# Patient Record
Sex: Female | Born: 1995 | Race: White | Hispanic: No | Marital: Single | Smoking: Never smoker
Health system: Southern US, Community
[De-identification: ages and names within clinical notes are randomized; demographics above are authoritative.]

## PROBLEM LIST (undated history)

## (undated) DIAGNOSIS — K5010 Crohn's disease of large intestine without complications: Secondary | ICD-10-CM

---
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
## 2017-01-24 ENCOUNTER — Ambulatory Visit: Payer: BLUE CROSS/BLUE SHIELD

## 2017-02-03 ENCOUNTER — Ambulatory Visit (INDEPENDENT_AMBULATORY_CARE_PROVIDER_SITE_OTHER): Payer: BLUE CROSS/BLUE SHIELD | Admitting: *Deleted

## 2017-02-03 DIAGNOSIS — Z23 Encounter for immunization: Secondary | ICD-10-CM | POA: Diagnosis not present

## 2017-03-31 DIAGNOSIS — K5010 Crohn's disease of large intestine without complications: Secondary | ICD-10-CM | POA: Diagnosis not present

## 2017-03-31 DIAGNOSIS — K5080 Crohn's disease of both small and large intestine without complications: Secondary | ICD-10-CM | POA: Diagnosis not present

## 2017-03-31 DIAGNOSIS — R197 Diarrhea, unspecified: Secondary | ICD-10-CM | POA: Diagnosis not present

## 2017-03-31 DIAGNOSIS — R1084 Generalized abdominal pain: Secondary | ICD-10-CM | POA: Diagnosis not present

## 2017-04-06 DIAGNOSIS — D509 Iron deficiency anemia, unspecified: Secondary | ICD-10-CM | POA: Diagnosis not present

## 2017-04-13 DIAGNOSIS — D509 Iron deficiency anemia, unspecified: Secondary | ICD-10-CM | POA: Diagnosis not present

## 2017-05-03 DIAGNOSIS — K5080 Crohn's disease of both small and large intestine without complications: Secondary | ICD-10-CM | POA: Diagnosis not present

## 2017-05-03 DIAGNOSIS — K3189 Other diseases of stomach and duodenum: Secondary | ICD-10-CM | POA: Diagnosis not present

## 2017-05-12 DIAGNOSIS — D509 Iron deficiency anemia, unspecified: Secondary | ICD-10-CM | POA: Diagnosis not present

## 2017-05-24 ENCOUNTER — Ambulatory Visit: Payer: BLUE CROSS/BLUE SHIELD

## 2017-05-25 ENCOUNTER — Ambulatory Visit (INDEPENDENT_AMBULATORY_CARE_PROVIDER_SITE_OTHER): Payer: BLUE CROSS/BLUE SHIELD | Admitting: *Deleted

## 2017-05-25 DIAGNOSIS — Z23 Encounter for immunization: Secondary | ICD-10-CM | POA: Diagnosis not present

## 2017-05-27 DIAGNOSIS — D123 Benign neoplasm of transverse colon: Secondary | ICD-10-CM | POA: Diagnosis not present

## 2017-05-27 DIAGNOSIS — D125 Benign neoplasm of sigmoid colon: Secondary | ICD-10-CM | POA: Diagnosis not present

## 2017-05-27 DIAGNOSIS — K633 Ulcer of intestine: Secondary | ICD-10-CM | POA: Diagnosis not present

## 2017-05-27 DIAGNOSIS — K626 Ulcer of anus and rectum: Secondary | ICD-10-CM | POA: Diagnosis not present

## 2017-05-27 DIAGNOSIS — R197 Diarrhea, unspecified: Secondary | ICD-10-CM | POA: Diagnosis not present

## 2017-05-27 DIAGNOSIS — K5010 Crohn's disease of large intestine without complications: Secondary | ICD-10-CM | POA: Diagnosis not present

## 2017-06-01 DIAGNOSIS — K50111 Crohn's disease of large intestine with rectal bleeding: Secondary | ICD-10-CM | POA: Diagnosis not present

## 2017-06-21 DIAGNOSIS — K50111 Crohn's disease of large intestine with rectal bleeding: Secondary | ICD-10-CM | POA: Diagnosis not present

## 2017-07-04 DIAGNOSIS — Z111 Encounter for screening for respiratory tuberculosis: Secondary | ICD-10-CM | POA: Diagnosis not present

## 2017-07-04 DIAGNOSIS — K50811 Crohn's disease of both small and large intestine with rectal bleeding: Secondary | ICD-10-CM | POA: Diagnosis not present

## 2017-07-04 DIAGNOSIS — R197 Diarrhea, unspecified: Secondary | ICD-10-CM | POA: Diagnosis not present

## 2017-07-04 DIAGNOSIS — D509 Iron deficiency anemia, unspecified: Secondary | ICD-10-CM | POA: Diagnosis not present

## 2017-07-06 DIAGNOSIS — K5080 Crohn's disease of both small and large intestine without complications: Secondary | ICD-10-CM | POA: Diagnosis not present

## 2017-07-06 DIAGNOSIS — K50111 Crohn's disease of large intestine with rectal bleeding: Secondary | ICD-10-CM | POA: Diagnosis not present

## 2017-07-06 DIAGNOSIS — D509 Iron deficiency anemia, unspecified: Secondary | ICD-10-CM | POA: Diagnosis not present

## 2017-07-06 DIAGNOSIS — R197 Diarrhea, unspecified: Secondary | ICD-10-CM | POA: Diagnosis not present

## 2017-07-12 DIAGNOSIS — K50919 Crohn's disease, unspecified, with unspecified complications: Secondary | ICD-10-CM | POA: Diagnosis not present

## 2017-07-26 DIAGNOSIS — K50919 Crohn's disease, unspecified, with unspecified complications: Secondary | ICD-10-CM | POA: Diagnosis not present

## 2017-08-23 DIAGNOSIS — K5090 Crohn's disease, unspecified, without complications: Secondary | ICD-10-CM | POA: Diagnosis not present

## 2017-08-24 DIAGNOSIS — K5080 Crohn's disease of both small and large intestine without complications: Secondary | ICD-10-CM | POA: Diagnosis not present

## 2017-08-24 DIAGNOSIS — R1084 Generalized abdominal pain: Secondary | ICD-10-CM | POA: Diagnosis not present

## 2017-08-24 DIAGNOSIS — R197 Diarrhea, unspecified: Secondary | ICD-10-CM | POA: Diagnosis not present

## 2017-08-24 DIAGNOSIS — Z79899 Other long term (current) drug therapy: Secondary | ICD-10-CM | POA: Diagnosis not present

## 2017-08-26 DIAGNOSIS — K5080 Crohn's disease of both small and large intestine without complications: Secondary | ICD-10-CM | POA: Diagnosis not present

## 2017-09-15 DIAGNOSIS — M79662 Pain in left lower leg: Secondary | ICD-10-CM | POA: Diagnosis not present

## 2017-09-15 DIAGNOSIS — D509 Iron deficiency anemia, unspecified: Secondary | ICD-10-CM | POA: Diagnosis not present

## 2017-09-15 DIAGNOSIS — M7662 Achilles tendinitis, left leg: Secondary | ICD-10-CM | POA: Diagnosis not present

## 2017-09-26 DIAGNOSIS — D509 Iron deficiency anemia, unspecified: Secondary | ICD-10-CM | POA: Diagnosis not present

## 2017-10-18 DIAGNOSIS — K5080 Crohn's disease of both small and large intestine without complications: Secondary | ICD-10-CM | POA: Diagnosis not present

## 2017-11-18 DIAGNOSIS — Z79899 Other long term (current) drug therapy: Secondary | ICD-10-CM | POA: Diagnosis not present

## 2017-11-18 DIAGNOSIS — K5080 Crohn's disease of both small and large intestine without complications: Secondary | ICD-10-CM | POA: Diagnosis not present

## 2017-11-18 DIAGNOSIS — L03314 Cellulitis of groin: Secondary | ICD-10-CM | POA: Diagnosis not present

## 2017-12-12 DIAGNOSIS — K5080 Crohn's disease of both small and large intestine without complications: Secondary | ICD-10-CM | POA: Diagnosis not present

## 2017-12-27 DIAGNOSIS — K5080 Crohn's disease of both small and large intestine without complications: Secondary | ICD-10-CM | POA: Diagnosis not present

## 2018-01-08 DIAGNOSIS — J069 Acute upper respiratory infection, unspecified: Secondary | ICD-10-CM | POA: Diagnosis not present

## 2018-01-08 DIAGNOSIS — J029 Acute pharyngitis, unspecified: Secondary | ICD-10-CM | POA: Diagnosis not present

## 2018-01-31 ENCOUNTER — Ambulatory Visit (INDEPENDENT_AMBULATORY_CARE_PROVIDER_SITE_OTHER): Payer: BLUE CROSS/BLUE SHIELD | Admitting: Women's Health

## 2018-01-31 ENCOUNTER — Encounter: Payer: Self-pay | Admitting: Women's Health

## 2018-01-31 VITALS — BP 118/78 | Ht 62.0 in | Wt 133.0 lb

## 2018-01-31 DIAGNOSIS — Z30011 Encounter for initial prescription of contraceptive pills: Secondary | ICD-10-CM | POA: Diagnosis not present

## 2018-01-31 DIAGNOSIS — Z01419 Encounter for gynecological examination (general) (routine) without abnormal findings: Secondary | ICD-10-CM

## 2018-01-31 DIAGNOSIS — Z113 Encounter for screening for infections with a predominantly sexual mode of transmission: Secondary | ICD-10-CM | POA: Diagnosis not present

## 2018-01-31 MED ORDER — NORETHIN ACE-ETH ESTRAD-FE 1-20 MG-MCG PO TABS: 1.0000 | ORAL_TABLET | Freq: Every day | ORAL | 4 refills | Status: AC

## 2018-01-31 NOTE — Patient Instructions (Signed)

## 2018-01-31 NOTE — Progress Notes (Signed)
Ellen Jones 05/20/1996 [id number]    History:    Presents for annual exam.  Regular monthly cycle on Loestrin until April or May.  Started on Remicade in February and has had some irregular cycles since starting, skipped 1 month, heavy flow the following month, occasional spotting,  normal cycle last month.  First partner both.  States is feeling much better since starting on the Remicade, has actually gained about 25 pounds and states is able to eat.  Gardasil series completed.    Past medical history, past surgical history, family history and social history were all reviewed and documented in the EPIC chart.  Student at [employer].  Parents healthy.  ROS:  A ROS was performed and pertinent positives and negatives are included.  Exam:  Vitals:   01/31/18 1623  BP: 118/78  Weight: 133 lb (60.3 kg)  Height: 5\' 2"  (1.575 m)   Body mass index is 24.33 kg/m.   General appearance:  Normal Thyroid:  Symmetrical, normal in size, without palpable masses or nodularity. Respiratory  Auscultation:  Clear without wheezing or rhonchi Cardiovascular  Auscultation:  Regular rate, without rubs, murmurs or gallops  Edema/varicosities:  Not grossly evident Abdominal  Soft,nontender, without masses, guarding or rebound.  Liver/spleen:  No organomegaly noted  Hernia:  None appreciated  Skin  Inspection:  Grossly normal   Breasts: Examined lying and sitting.     Right: Without masses, retractions, discharge or axillary adenopathy.     Left: Without masses, retractions, discharge or axillary adenopathy. Gentitourinary   Inguinal/mons:  Normal without inguinal adenopathy  External genitalia:  Normal  BUS/Urethra/Skene's glands:  Normal  Vagina:  Normal  Cervix:  Normal  Uterus:   normal in size, shape and contour.  Midline and mobile  Adnexa/parametria:     Rt: Without masses or tenderness.   Lt: Without masses or tenderness.  Anus and perineum: Normal   Assessment/Plan:  22 y.o. S [facility] G0  for annual exam.     Mostly monthly cycle on Loestrin Crohn's started on Remicade February 2019 Labs - GI  Plan: GC/chlamydia, denies need for STD screen.  Will continue on  Loestrin 1/20 and will call if continued problems with cycle,   Prescription, proper use, slight risk for blood clots and strokes reviewed.  Condoms encouraged until permanent partner and until cycles regulate.  SBEs, exercise, calcium rich foods, continue multivitamin daily.  Campus safety reviewed.   Harrington Challengerancy J [facility] LP[facility]NP, 5:37 PM 01/31/2018

## 2018-02-02 LAB — C. TRACHOMATIS/N. GONORRHOEAE RNA

## 2018-02-17 DIAGNOSIS — K5080 Crohn's disease of both small and large intestine without complications: Secondary | ICD-10-CM | POA: Diagnosis not present

## 2018-04-19 DIAGNOSIS — K5090 Crohn's disease, unspecified, without complications: Secondary | ICD-10-CM | POA: Diagnosis not present

## 2018-04-19 DIAGNOSIS — K5080 Crohn's disease of both small and large intestine without complications: Secondary | ICD-10-CM | POA: Diagnosis not present

## 2018-06-14 DIAGNOSIS — K5090 Crohn's disease, unspecified, without complications: Secondary | ICD-10-CM | POA: Diagnosis not present

## 2018-08-09 DIAGNOSIS — R740 Nonspecific elevation of levels of transaminase and lactic acid dehydrogenase [LDH]: Secondary | ICD-10-CM | POA: Diagnosis not present

## 2018-08-09 DIAGNOSIS — K5090 Crohn's disease, unspecified, without complications: Secondary | ICD-10-CM | POA: Diagnosis not present

## 2018-08-09 DIAGNOSIS — K5080 Crohn's disease of both small and large intestine without complications: Secondary | ICD-10-CM | POA: Diagnosis not present

## 2018-08-09 DIAGNOSIS — Z79899 Other long term (current) drug therapy: Secondary | ICD-10-CM | POA: Diagnosis not present

## 2018-08-15 DIAGNOSIS — R740 Nonspecific elevation of levels of transaminase and lactic acid dehydrogenase [LDH]: Secondary | ICD-10-CM | POA: Diagnosis not present

## 2018-08-23 DIAGNOSIS — K5080 Crohn's disease of both small and large intestine without complications: Secondary | ICD-10-CM | POA: Diagnosis not present

## 2018-08-23 DIAGNOSIS — R740 Nonspecific elevation of levels of transaminase and lactic acid dehydrogenase [LDH]: Secondary | ICD-10-CM | POA: Diagnosis not present

## 2018-08-24 DIAGNOSIS — J069 Acute upper respiratory infection, unspecified: Secondary | ICD-10-CM | POA: Diagnosis not present

## 2018-08-24 DIAGNOSIS — J029 Acute pharyngitis, unspecified: Secondary | ICD-10-CM | POA: Diagnosis not present

## 2018-09-19 DIAGNOSIS — Z880 Allergy status to penicillin: Secondary | ICD-10-CM | POA: Diagnosis not present

## 2018-09-19 DIAGNOSIS — K5090 Crohn's disease, unspecified, without complications: Secondary | ICD-10-CM | POA: Diagnosis not present

## 2018-09-19 DIAGNOSIS — S63501A Unspecified sprain of right wrist, initial encounter: Secondary | ICD-10-CM | POA: Diagnosis not present

## 2018-09-19 DIAGNOSIS — Z79899 Other long term (current) drug therapy: Secondary | ICD-10-CM | POA: Diagnosis not present

## 2018-09-19 DIAGNOSIS — Z881 Allergy status to other antibiotic agents status: Secondary | ICD-10-CM | POA: Diagnosis not present

## 2018-10-04 DIAGNOSIS — K5080 Crohn's disease of both small and large intestine without complications: Secondary | ICD-10-CM | POA: Diagnosis not present

## 2018-10-27 DIAGNOSIS — R740 Nonspecific elevation of levels of transaminase and lactic acid dehydrogenase [LDH]: Secondary | ICD-10-CM | POA: Diagnosis not present

## 2018-11-30 DIAGNOSIS — R7989 Other specified abnormal findings of blood chemistry: Secondary | ICD-10-CM | POA: Diagnosis not present

## 2018-11-30 DIAGNOSIS — R768 Other specified abnormal immunological findings in serum: Secondary | ICD-10-CM | POA: Diagnosis not present

## 2018-11-30 DIAGNOSIS — K5080 Crohn's disease of both small and large intestine without complications: Secondary | ICD-10-CM | POA: Diagnosis not present

## 2018-11-30 DIAGNOSIS — K5090 Crohn's disease, unspecified, without complications: Secondary | ICD-10-CM | POA: Diagnosis not present

## 2018-12-22 IMAGING — CT CT ABD-PELV W/O CM
2 of 4 series · 15 of 46 positions shown, 17 images · non-contrast
Comparison: 07/10/2016 CT

CLINICAL DATA: Diarrhea, fever, nausea and vomiting. History of
Crohn disease.

EXAM:
CT ABDOMEN AND PELVIS WITHOUT CONTRAST
TECHNIQUE: Multidetector CT imaging of the abdomen and pelvis was performed
following the standard protocol without IV contrast.

[Series 2: axial st · axial · 0.58mm/px · z∈[-402,-22]mm · 12 of 90 slices shown, 14 images]
[im 7/90  soft-tissue]
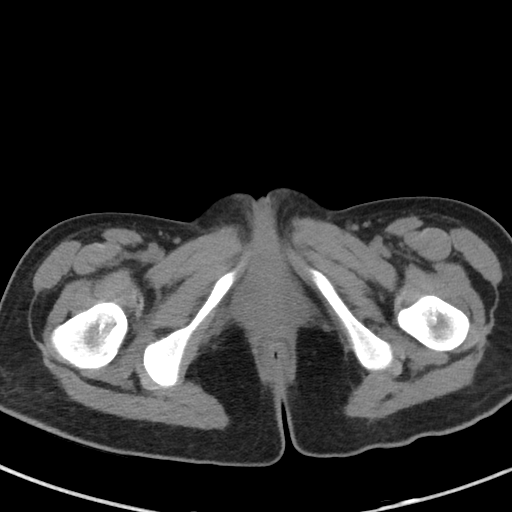
[im 7/90  bone]
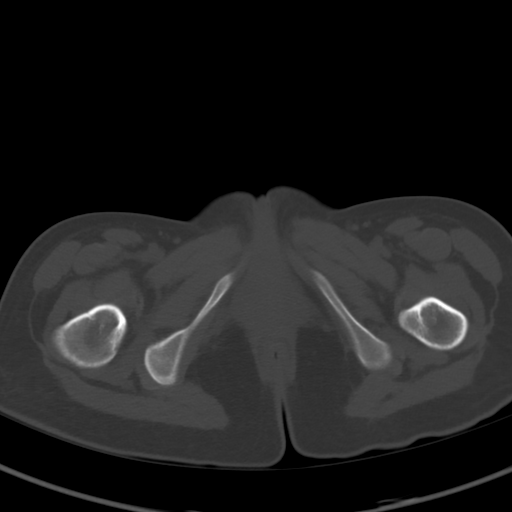
[im 14/90  soft-tissue]
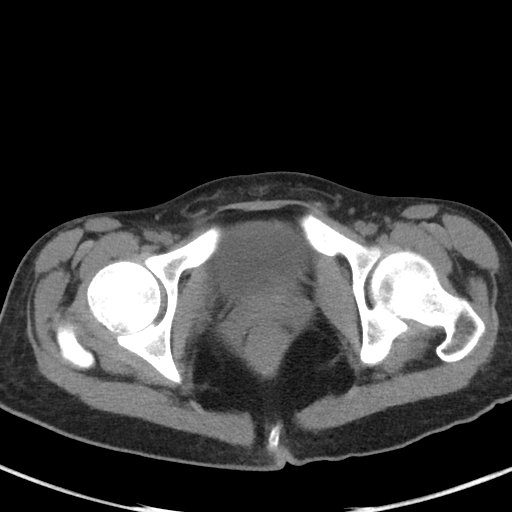
[im 21/90  soft-tissue]
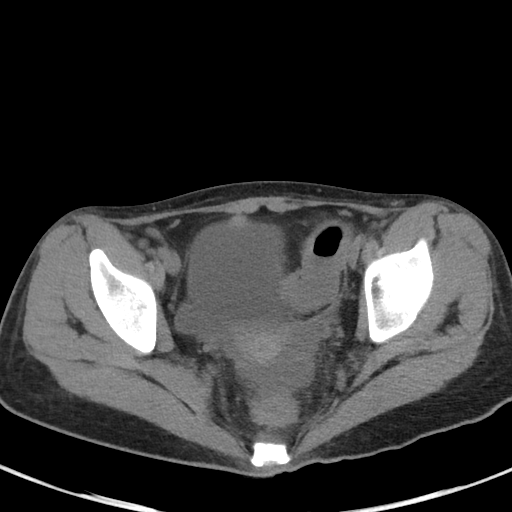
[im 28/90  soft-tissue]
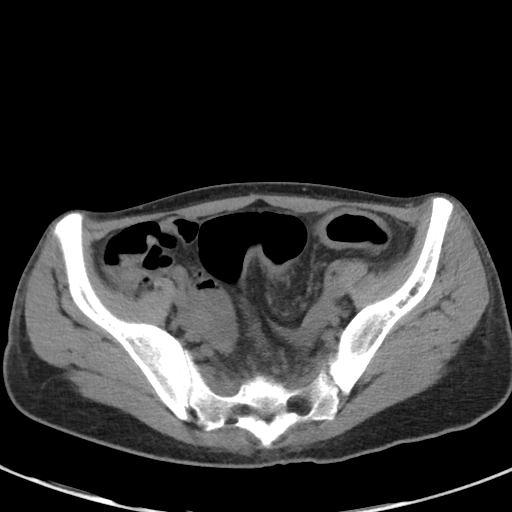
[im 35/90  soft-tissue]
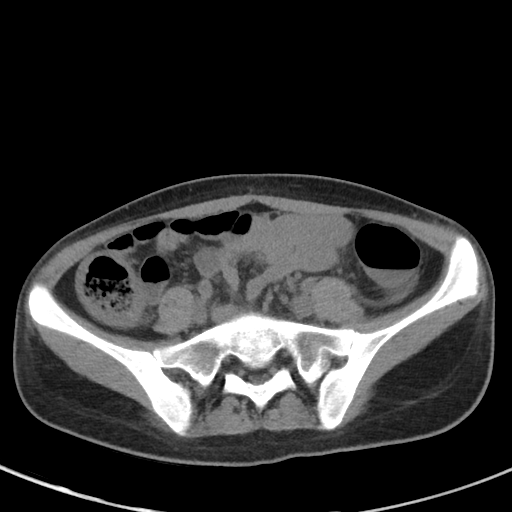
[im 42/90  soft-tissue]
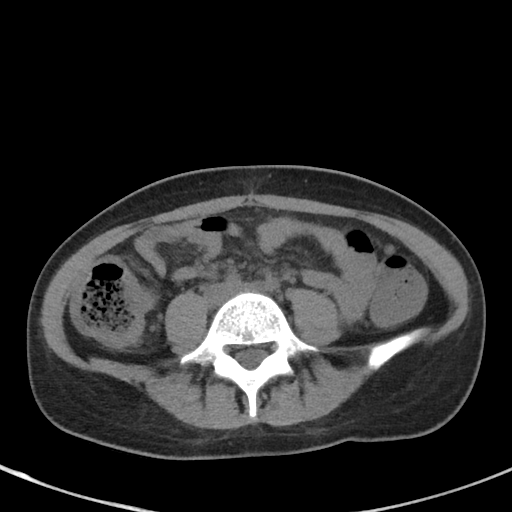
[im 48/90  soft-tissue]
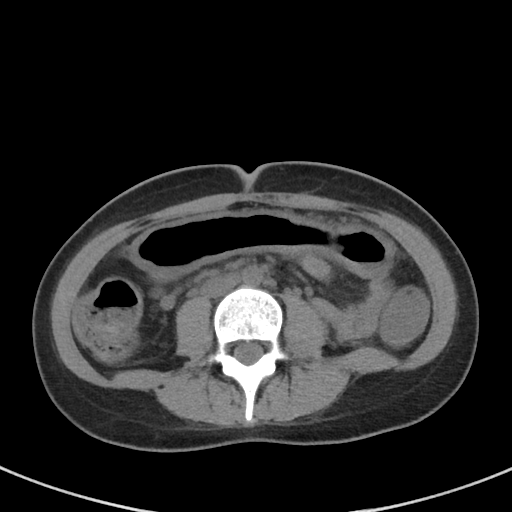
[im 55/90  soft-tissue]
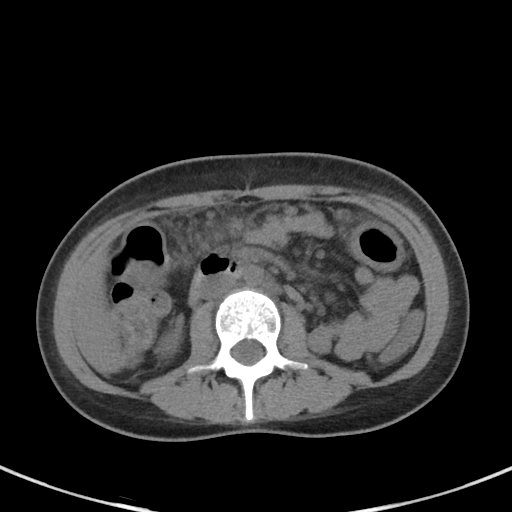
[im 62/90  soft-tissue]
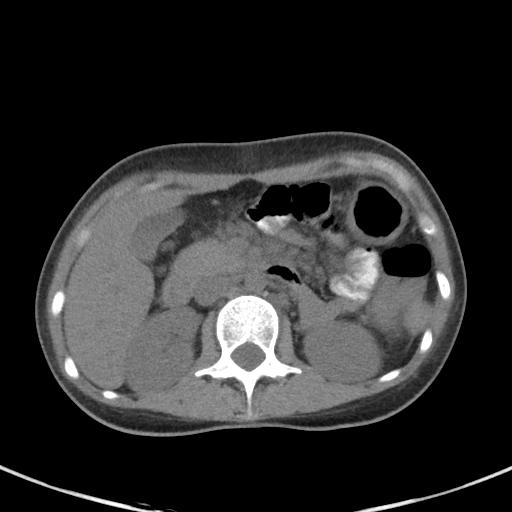
[im 62/90  bone]
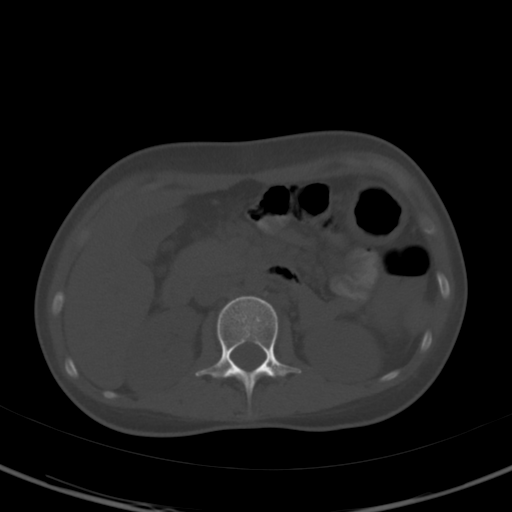
[im 69/90  soft-tissue]
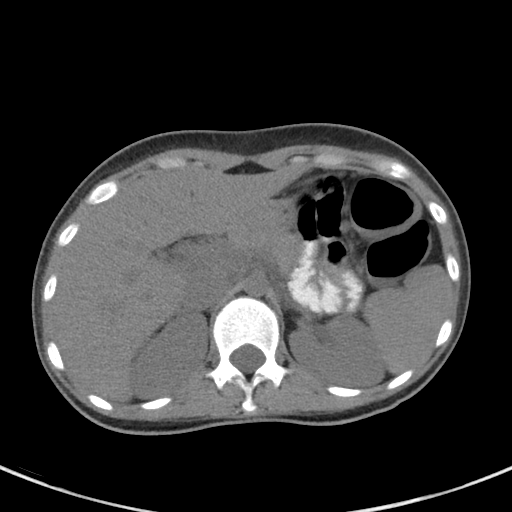
[im 76/90  soft-tissue]
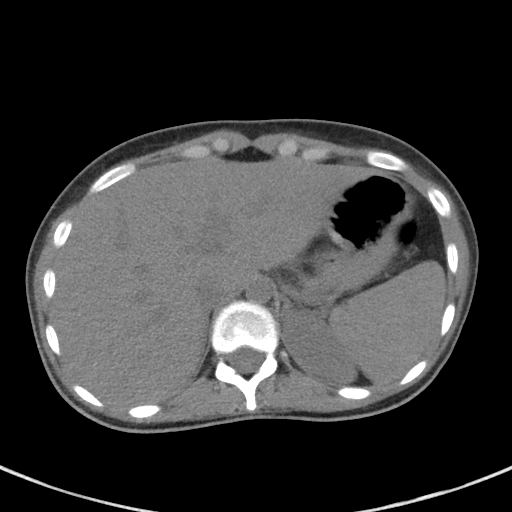
[im 83/90  soft-tissue]
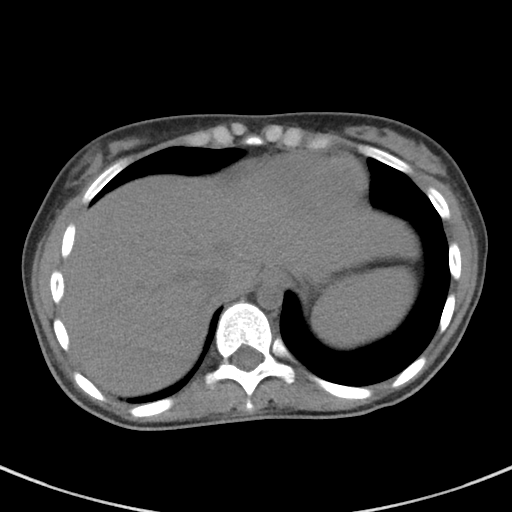

[Series 5: coronal st · coronal · 0.60mm/px · 3 of 67 slices shown]
[im 23/67  soft-tissue]
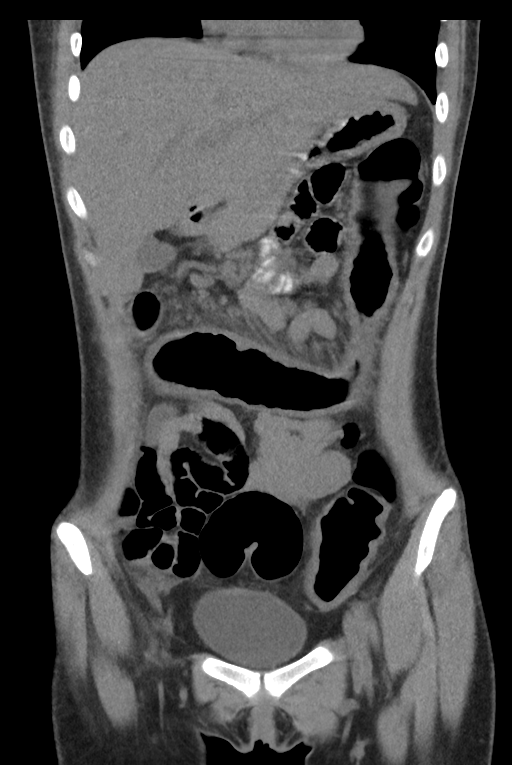
[im 30/67  soft-tissue]
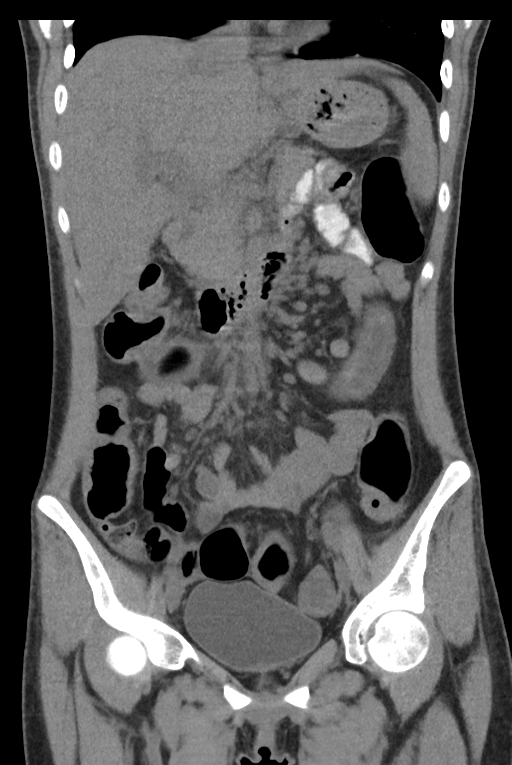
[im 37/67  soft-tissue]
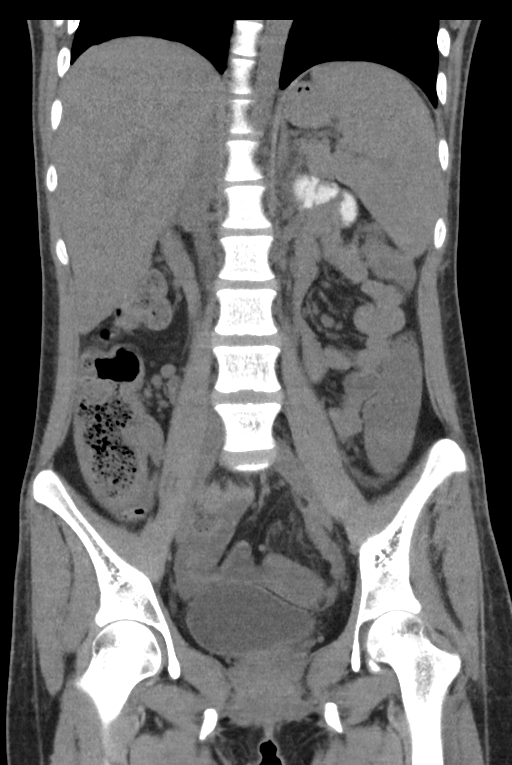

[15 of 46 positions shown; findings below may reference images not displayed]

FINDINGS: Lower chest: No acute abnormality.

Hepatobiliary: No focal liver abnormality is seen. No gallstones,
gallbladder wall thickening, or biliary dilatation.

Pancreas: Unremarkable. No pancreatic ductal dilatation or
surrounding inflammatory changes.

Spleen: Normal in size without focal abnormality.

Adrenals/Urinary Tract: Adrenal glands are unremarkable. Kidneys are
normal, without renal calculi, focal lesion, or hydronephrosis.
Bladder is unremarkable.

Stomach/Bowel: Normal appearance of the stomach and small bowel.
Transmural thickening of large bowel is again noted from cecum
through rectum with some sparing of the mid descending colon and
distal sigmoid. Normal appendix. Mild perirectal fatty inflammation.

Vascular/Lymphatic: No significant vascular findings are present. No
enlarged abdominal or pelvic lymph nodes. Numerous small mesenteric
lymph nodes are again visualized.

Reproductive: Uterus and bilateral adnexa are unremarkable.

Other: Small amount of free fluid in the pelvis.

Musculoskeletal: No acute or significant osseous findings.
IMPRESSION: 1. Diffuse transmural inflammatory thickening of large bowel
consistent with colitis likely related to patient's underlying Crohn
disease. No bowel obstruction or perforation.
2. Mesenteric adenopathy likely reactive.
3. Small amount of physiologic free fluid in the pelvis.

## 2019-01-26 DIAGNOSIS — K5090 Crohn's disease, unspecified, without complications: Secondary | ICD-10-CM | POA: Diagnosis not present

## 2019-02-01 DIAGNOSIS — K5140 Inflammatory polyps of colon without complications: Secondary | ICD-10-CM | POA: Diagnosis not present

## 2019-02-01 DIAGNOSIS — D123 Benign neoplasm of transverse colon: Secondary | ICD-10-CM | POA: Diagnosis not present

## 2019-02-01 DIAGNOSIS — Z1211 Encounter for screening for malignant neoplasm of colon: Secondary | ICD-10-CM | POA: Diagnosis not present

## 2019-02-01 DIAGNOSIS — D125 Benign neoplasm of sigmoid colon: Secondary | ICD-10-CM | POA: Diagnosis not present

## 2019-02-01 DIAGNOSIS — K5090 Crohn's disease, unspecified, without complications: Secondary | ICD-10-CM | POA: Diagnosis not present

## 2019-02-01 DIAGNOSIS — K635 Polyp of colon: Secondary | ICD-10-CM | POA: Diagnosis not present

## 2019-03-09 DIAGNOSIS — K5090 Crohn's disease, unspecified, without complications: Secondary | ICD-10-CM | POA: Diagnosis not present

## 2019-03-09 DIAGNOSIS — K5080 Crohn's disease of both small and large intestine without complications: Secondary | ICD-10-CM | POA: Diagnosis not present

## 2019-04-10 DIAGNOSIS — J029 Acute pharyngitis, unspecified: Secondary | ICD-10-CM | POA: Diagnosis not present

## 2019-04-10 DIAGNOSIS — Z20828 Contact with and (suspected) exposure to other viral communicable diseases: Secondary | ICD-10-CM | POA: Diagnosis not present

## 2019-04-24 DIAGNOSIS — K5090 Crohn's disease, unspecified, without complications: Secondary | ICD-10-CM | POA: Diagnosis not present

## 2019-04-24 DIAGNOSIS — K5080 Crohn's disease of both small and large intestine without complications: Secondary | ICD-10-CM | POA: Diagnosis not present

## 2019-05-24 DIAGNOSIS — K5090 Crohn's disease, unspecified, without complications: Secondary | ICD-10-CM | POA: Diagnosis not present

## 2019-05-24 DIAGNOSIS — R0981 Nasal congestion: Secondary | ICD-10-CM | POA: Diagnosis not present

## 2019-05-24 DIAGNOSIS — Z8619 Personal history of other infectious and parasitic diseases: Secondary | ICD-10-CM | POA: Diagnosis not present

## 2019-05-24 DIAGNOSIS — Z79899 Other long term (current) drug therapy: Secondary | ICD-10-CM | POA: Diagnosis not present

## 2019-05-24 DIAGNOSIS — Z20828 Contact with and (suspected) exposure to other viral communicable diseases: Secondary | ICD-10-CM | POA: Diagnosis not present

## 2019-05-24 DIAGNOSIS — Z881 Allergy status to other antibiotic agents status: Secondary | ICD-10-CM | POA: Diagnosis not present

## 2019-06-13 DIAGNOSIS — K5090 Crohn's disease, unspecified, without complications: Secondary | ICD-10-CM | POA: Diagnosis not present

## 2019-07-15 DIAGNOSIS — Z03818 Encounter for observation for suspected exposure to other biological agents ruled out: Secondary | ICD-10-CM | POA: Diagnosis not present

## 2019-07-15 DIAGNOSIS — R519 Headache, unspecified: Secondary | ICD-10-CM | POA: Diagnosis not present

## 2019-07-15 DIAGNOSIS — M791 Myalgia, unspecified site: Secondary | ICD-10-CM | POA: Diagnosis not present

## 2019-07-25 DIAGNOSIS — K5090 Crohn's disease, unspecified, without complications: Secondary | ICD-10-CM | POA: Diagnosis not present

## 2019-09-05 DIAGNOSIS — K5090 Crohn's disease, unspecified, without complications: Secondary | ICD-10-CM | POA: Diagnosis not present

## 2019-10-04 DIAGNOSIS — Z3400 Encounter for supervision of normal first pregnancy, unspecified trimester: Secondary | ICD-10-CM | POA: Diagnosis not present

## 2019-10-04 DIAGNOSIS — Z3401 Encounter for supervision of normal first pregnancy, first trimester: Secondary | ICD-10-CM | POA: Diagnosis not present

## 2019-10-04 DIAGNOSIS — O3680X0 Pregnancy with inconclusive fetal viability, not applicable or unspecified: Secondary | ICD-10-CM | POA: Diagnosis not present

## 2019-10-04 DIAGNOSIS — Z124 Encounter for screening for malignant neoplasm of cervix: Secondary | ICD-10-CM | POA: Diagnosis not present

## 2019-10-04 DIAGNOSIS — Z8719 Personal history of other diseases of the digestive system: Secondary | ICD-10-CM | POA: Diagnosis not present

## 2019-10-04 DIAGNOSIS — Z3A09 9 weeks gestation of pregnancy: Secondary | ICD-10-CM | POA: Diagnosis not present

## 2019-10-04 DIAGNOSIS — Z113 Encounter for screening for infections with a predominantly sexual mode of transmission: Secondary | ICD-10-CM | POA: Diagnosis not present

## 2019-10-12 DIAGNOSIS — K5080 Crohn's disease of both small and large intestine without complications: Secondary | ICD-10-CM | POA: Diagnosis not present

## 2019-10-12 DIAGNOSIS — Z79899 Other long term (current) drug therapy: Secondary | ICD-10-CM | POA: Diagnosis not present

## 2019-10-17 DIAGNOSIS — K5090 Crohn's disease, unspecified, without complications: Secondary | ICD-10-CM | POA: Diagnosis not present

## 2019-10-26 DIAGNOSIS — Z3682 Encounter for antenatal screening for nuchal translucency: Secondary | ICD-10-CM | POA: Diagnosis not present

## 2019-10-26 DIAGNOSIS — Z3401 Encounter for supervision of normal first pregnancy, first trimester: Secondary | ICD-10-CM | POA: Diagnosis not present

## 2019-10-26 DIAGNOSIS — Z3A12 12 weeks gestation of pregnancy: Secondary | ICD-10-CM | POA: Diagnosis not present

## 2019-11-28 DIAGNOSIS — K5080 Crohn's disease of both small and large intestine without complications: Secondary | ICD-10-CM | POA: Diagnosis not present

## 2019-12-07 DIAGNOSIS — Z3689 Encounter for other specified antenatal screening: Secondary | ICD-10-CM | POA: Diagnosis not present

## 2019-12-07 DIAGNOSIS — Z369 Encounter for antenatal screening, unspecified: Secondary | ICD-10-CM | POA: Diagnosis not present

## 2019-12-07 DIAGNOSIS — K5080 Crohn's disease of both small and large intestine without complications: Secondary | ICD-10-CM | POA: Diagnosis not present

## 2020-01-11 DIAGNOSIS — K5090 Crohn's disease, unspecified, without complications: Secondary | ICD-10-CM | POA: Diagnosis not present

## 2020-01-21 DIAGNOSIS — Z3402 Encounter for supervision of normal first pregnancy, second trimester: Secondary | ICD-10-CM | POA: Diagnosis not present

## 2020-01-21 DIAGNOSIS — Z3A24 24 weeks gestation of pregnancy: Secondary | ICD-10-CM | POA: Diagnosis not present

## 2020-01-21 DIAGNOSIS — K5080 Crohn's disease of both small and large intestine without complications: Secondary | ICD-10-CM | POA: Diagnosis not present

## 2020-02-18 DIAGNOSIS — Z3A28 28 weeks gestation of pregnancy: Secondary | ICD-10-CM | POA: Diagnosis not present

## 2020-02-18 DIAGNOSIS — E538 Deficiency of other specified B group vitamins: Secondary | ICD-10-CM | POA: Diagnosis not present

## 2020-02-18 DIAGNOSIS — O99613 Diseases of the digestive system complicating pregnancy, third trimester: Secondary | ICD-10-CM | POA: Diagnosis not present

## 2020-02-18 DIAGNOSIS — K5090 Crohn's disease, unspecified, without complications: Secondary | ICD-10-CM | POA: Diagnosis not present

## 2020-03-10 DIAGNOSIS — O26893 Other specified pregnancy related conditions, third trimester: Secondary | ICD-10-CM | POA: Diagnosis not present

## 2020-03-10 DIAGNOSIS — Z23 Encounter for immunization: Secondary | ICD-10-CM | POA: Diagnosis not present

## 2020-03-10 DIAGNOSIS — Z6791 Unspecified blood type, Rh negative: Secondary | ICD-10-CM | POA: Diagnosis not present

## 2020-03-10 DIAGNOSIS — Z3A31 31 weeks gestation of pregnancy: Secondary | ICD-10-CM | POA: Diagnosis not present

## 2020-03-13 DIAGNOSIS — O4103X1 Oligohydramnios, third trimester, fetus 1: Secondary | ICD-10-CM | POA: Diagnosis not present

## 2020-03-13 DIAGNOSIS — K5080 Crohn's disease of both small and large intestine without complications: Secondary | ICD-10-CM | POA: Diagnosis not present

## 2020-03-20 DIAGNOSIS — O4103X0 Oligohydramnios, third trimester, not applicable or unspecified: Secondary | ICD-10-CM | POA: Diagnosis not present

## 2020-03-20 DIAGNOSIS — K50819 Crohn's disease of both small and large intestine with unspecified complications: Secondary | ICD-10-CM | POA: Diagnosis not present

## 2020-03-20 DIAGNOSIS — Z3A33 33 weeks gestation of pregnancy: Secondary | ICD-10-CM | POA: Diagnosis not present

## 2020-03-26 DIAGNOSIS — K5080 Crohn's disease of both small and large intestine without complications: Secondary | ICD-10-CM | POA: Diagnosis not present

## 2020-03-26 DIAGNOSIS — Z369 Encounter for antenatal screening, unspecified: Secondary | ICD-10-CM | POA: Diagnosis not present

## 2020-04-02 DIAGNOSIS — Z3A34 34 weeks gestation of pregnancy: Secondary | ICD-10-CM | POA: Diagnosis not present

## 2020-04-02 DIAGNOSIS — K50819 Crohn's disease of both small and large intestine with unspecified complications: Secondary | ICD-10-CM | POA: Diagnosis not present

## 2020-04-02 DIAGNOSIS — O4103X0 Oligohydramnios, third trimester, not applicable or unspecified: Secondary | ICD-10-CM | POA: Diagnosis not present

## 2020-04-08 DIAGNOSIS — K5090 Crohn's disease, unspecified, without complications: Secondary | ICD-10-CM | POA: Diagnosis not present

## 2020-04-09 DIAGNOSIS — Z369 Encounter for antenatal screening, unspecified: Secondary | ICD-10-CM | POA: Diagnosis not present

## 2020-04-09 DIAGNOSIS — O4103X0 Oligohydramnios, third trimester, not applicable or unspecified: Secondary | ICD-10-CM | POA: Diagnosis not present

## 2020-04-09 DIAGNOSIS — K50819 Crohn's disease of both small and large intestine with unspecified complications: Secondary | ICD-10-CM | POA: Diagnosis not present

## 2020-04-14 DIAGNOSIS — O0993 Supervision of high risk pregnancy, unspecified, third trimester: Secondary | ICD-10-CM | POA: Diagnosis not present

## 2020-04-14 DIAGNOSIS — Z113 Encounter for screening for infections with a predominantly sexual mode of transmission: Secondary | ICD-10-CM | POA: Diagnosis not present

## 2020-04-14 DIAGNOSIS — Z3A36 36 weeks gestation of pregnancy: Secondary | ICD-10-CM | POA: Diagnosis not present

## 2020-04-14 DIAGNOSIS — Z3685 Encounter for antenatal screening for Streptococcus B: Secondary | ICD-10-CM | POA: Diagnosis not present

## 2020-04-14 DIAGNOSIS — Z369 Encounter for antenatal screening, unspecified: Secondary | ICD-10-CM | POA: Diagnosis not present

## 2020-04-16 DIAGNOSIS — O0993 Supervision of high risk pregnancy, unspecified, third trimester: Secondary | ICD-10-CM | POA: Diagnosis not present

## 2020-04-16 DIAGNOSIS — K5080 Crohn's disease of both small and large intestine without complications: Secondary | ICD-10-CM | POA: Diagnosis not present

## 2020-04-23 DIAGNOSIS — O0993 Supervision of high risk pregnancy, unspecified, third trimester: Secondary | ICD-10-CM | POA: Diagnosis not present

## 2020-04-23 DIAGNOSIS — K5080 Crohn's disease of both small and large intestine without complications: Secondary | ICD-10-CM | POA: Diagnosis not present

## 2020-04-30 DIAGNOSIS — O99214 Obesity complicating childbirth: Secondary | ICD-10-CM | POA: Diagnosis not present

## 2020-04-30 DIAGNOSIS — K5080 Crohn's disease of both small and large intestine without complications: Secondary | ICD-10-CM | POA: Diagnosis not present

## 2020-04-30 DIAGNOSIS — Z3A39 39 weeks gestation of pregnancy: Secondary | ICD-10-CM | POA: Diagnosis not present

## 2020-04-30 DIAGNOSIS — O4103X0 Oligohydramnios, third trimester, not applicable or unspecified: Secondary | ICD-10-CM | POA: Diagnosis not present

## 2020-04-30 DIAGNOSIS — Z3A38 38 weeks gestation of pregnancy: Secondary | ICD-10-CM | POA: Diagnosis not present

## 2020-04-30 DIAGNOSIS — O9962 Diseases of the digestive system complicating childbirth: Secondary | ICD-10-CM | POA: Diagnosis not present

## 2020-04-30 DIAGNOSIS — Z7952 Long term (current) use of systemic steroids: Secondary | ICD-10-CM | POA: Diagnosis not present

## 2020-04-30 DIAGNOSIS — Z79899 Other long term (current) drug therapy: Secondary | ICD-10-CM | POA: Diagnosis not present

## 2020-05-13 DIAGNOSIS — K5080 Crohn's disease of both small and large intestine without complications: Secondary | ICD-10-CM | POA: Diagnosis not present
